# Patient Record
Sex: Male | Born: 1985 | Race: White | Hispanic: No | Marital: Married | Smoking: Current every day smoker
Health system: Southern US, Community
[De-identification: ages and names within clinical notes are randomized; demographics above are authoritative.]

---
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
## 2005-01-14 ENCOUNTER — Emergency Department (HOSPITAL_COMMUNITY): Admission: EM | Admit: 2005-01-14 | Discharge: 2005-01-14 | Payer: Self-pay | Admitting: Emergency Medicine

## 2005-11-07 ENCOUNTER — Emergency Department (HOSPITAL_COMMUNITY): Admission: EM | Admit: 2005-11-07 | Discharge: 2005-11-07 | Payer: Self-pay | Admitting: Emergency Medicine

## 2005-11-14 ENCOUNTER — Emergency Department (HOSPITAL_COMMUNITY): Admission: EM | Admit: 2005-11-14 | Discharge: 2005-11-14 | Payer: Self-pay | Admitting: Emergency Medicine

## 2012-01-08 ENCOUNTER — Emergency Department (HOSPITAL_BASED_OUTPATIENT_CLINIC_OR_DEPARTMENT_OTHER)
Admission: EM | Admit: 2012-01-08 | Discharge: 2012-01-09 | Disposition: A | Discharge status: Home / Self Care | Payer: Self-pay | Attending: Emergency Medicine | Admitting: Emergency Medicine

## 2012-01-08 ENCOUNTER — Encounter (HOSPITAL_BASED_OUTPATIENT_CLINIC_OR_DEPARTMENT_OTHER): Payer: Self-pay | Admitting: *Deleted

## 2012-01-08 ENCOUNTER — Emergency Department (HOSPITAL_BASED_OUTPATIENT_CLINIC_OR_DEPARTMENT_OTHER): Payer: Self-pay

## 2012-01-08 DIAGNOSIS — X58XXXA Exposure to other specified factors, initial encounter: Secondary | ICD-10-CM | POA: Insufficient documentation

## 2012-01-08 DIAGNOSIS — Y9364 Activity, baseball: Secondary | ICD-10-CM | POA: Insufficient documentation

## 2012-01-08 DIAGNOSIS — S6990XA Unspecified injury of unspecified wrist, hand and finger(s), initial encounter: Secondary | ICD-10-CM | POA: Insufficient documentation

## 2012-01-08 DIAGNOSIS — S60221A Contusion of right hand, initial encounter: Secondary | ICD-10-CM

## 2012-01-08 DIAGNOSIS — S60511A Abrasion of right hand, initial encounter: Secondary | ICD-10-CM

## 2012-01-08 DIAGNOSIS — Y998 Other external cause status: Secondary | ICD-10-CM | POA: Insufficient documentation

## 2012-01-08 MED ORDER — NAPROXEN 250 MG PO TABS
500.0000 mg | ORAL_TABLET | Freq: Once | ORAL | Status: AC
  Administered 2012-01-08: 500 mg via ORAL
  Filled 2012-01-08: qty 2

## 2012-01-08 MED ORDER — HYDROCODONE-ACETAMINOPHEN 5-325 MG PO TABS: 1.0000 | ORAL_TABLET | Freq: Four times a day (QID) | ORAL | Status: AC | PRN

## 2012-01-08 NOTE — ED Provider Notes (Signed)
History     CSN: [id number]  Arrival date & time 01-20-2012  2250   First MD Initiated Contact with Patient January 20, 2012 2344      Chief Complaint  Patient presents with  . Hand Injury    (Consider location/radiation/quality/duration/timing/severity/associated sxs/prior treatment) HPI Is a 26 year old white male who was playing softball this evening is that [place] the [place]. In the process he injured his right hand. Specifically he is having pain and swelling of the right hyperthenar eminence. There is no gross deformity and no motor or sensory deficit. There is a superficial abrasion present. The pain is moderate and worse with extension at the right wrist. It is also exacerbated with palpation. He denies other injury. He states his tetanus status is up-to-date.  History reviewed. No pertinent past medical history.  History reviewed. No pertinent past surgical history.  No family history on file.  History  Substance Use Topics  . Smoking status: Not on file  . Smokeless tobacco: Not on file  . Alcohol Use: Not on file      Review of Systems  All other systems reviewed and are negative.    Allergies  Review of patient's allergies indicates no known allergies.  Home Medications  No current outpatient prescriptions on file.  BP 130/67  Pulse 72  Temp 98.1 F (36.7 C) (Oral)  Resp 16  Ht 6' (1.829 m)  Wt 140 lb (63.504 kg)  BMI 18.99 kg/m2  SpO2 98%  Physical Exam General: Well-developed, well-nourished male in no acute distress; appearance consistent with age of record HENT: normocephalic, atraumatic Eyes: Normal appearance Neck: supple Heart: regular rate and rhythm Lungs: clear to auscultation bilaterally Abdomen: soft; nondistended Extremities: No deformity; full range of motion; swelling and tenderness of right hyperthenar eminence with small superficial abrasion; no right snuff box tenderness Neurologic: Awake, alert and oriented; motor function intact in  all extremities and symmetric; no facial droop Skin: Warm and dry Psychiatric: Normal mood and affect    ED Course  Procedures (including critical care time)     MDM  Nursing notes and vitals signs, including pulse oximetry, reviewed.  Summary of this visit's results, reviewed by myself:  Imaging Studies: Dg Hand Complete Right  01/20/12  *RADIOLOGY REPORT*  Clinical Data: 26 year old male blunt trauma, pain.  RIGHT HAND - COMPLETE 3+ VIEW  Comparison: None.  Findings: Distal radius and ulna appear intact.  Carpal bone alignment within normal limits.  Joint spaces preserved. Metacarpals intact.  No acute fracture identified in the right hand.  IMPRESSION: No acute fracture or dislocation identified about the right hand.   Original Report Authenticated By: Harley Hallmark, M.D.             Hanley Seamen, MD Jan 20, 2012 2352

## 2012-01-08 NOTE — ED Notes (Signed)
Pt. Playing soft ball this night and slid into home base per Pt. Jamming the R hand into the ground.  NOted edema on the palm side of hand with pain past the r wrist.

## 2013-05-08 IMAGING — CR DG HAND COMPLETE 3+V*R*
3 series · 3 of 3 positions shown · non-contrast
Comparison: None.

CLINICAL DATA: 25-year-old male blunt trauma, pain.

RIGHT HAND - COMPLETE 3+ VIEW

[x hand pa right]
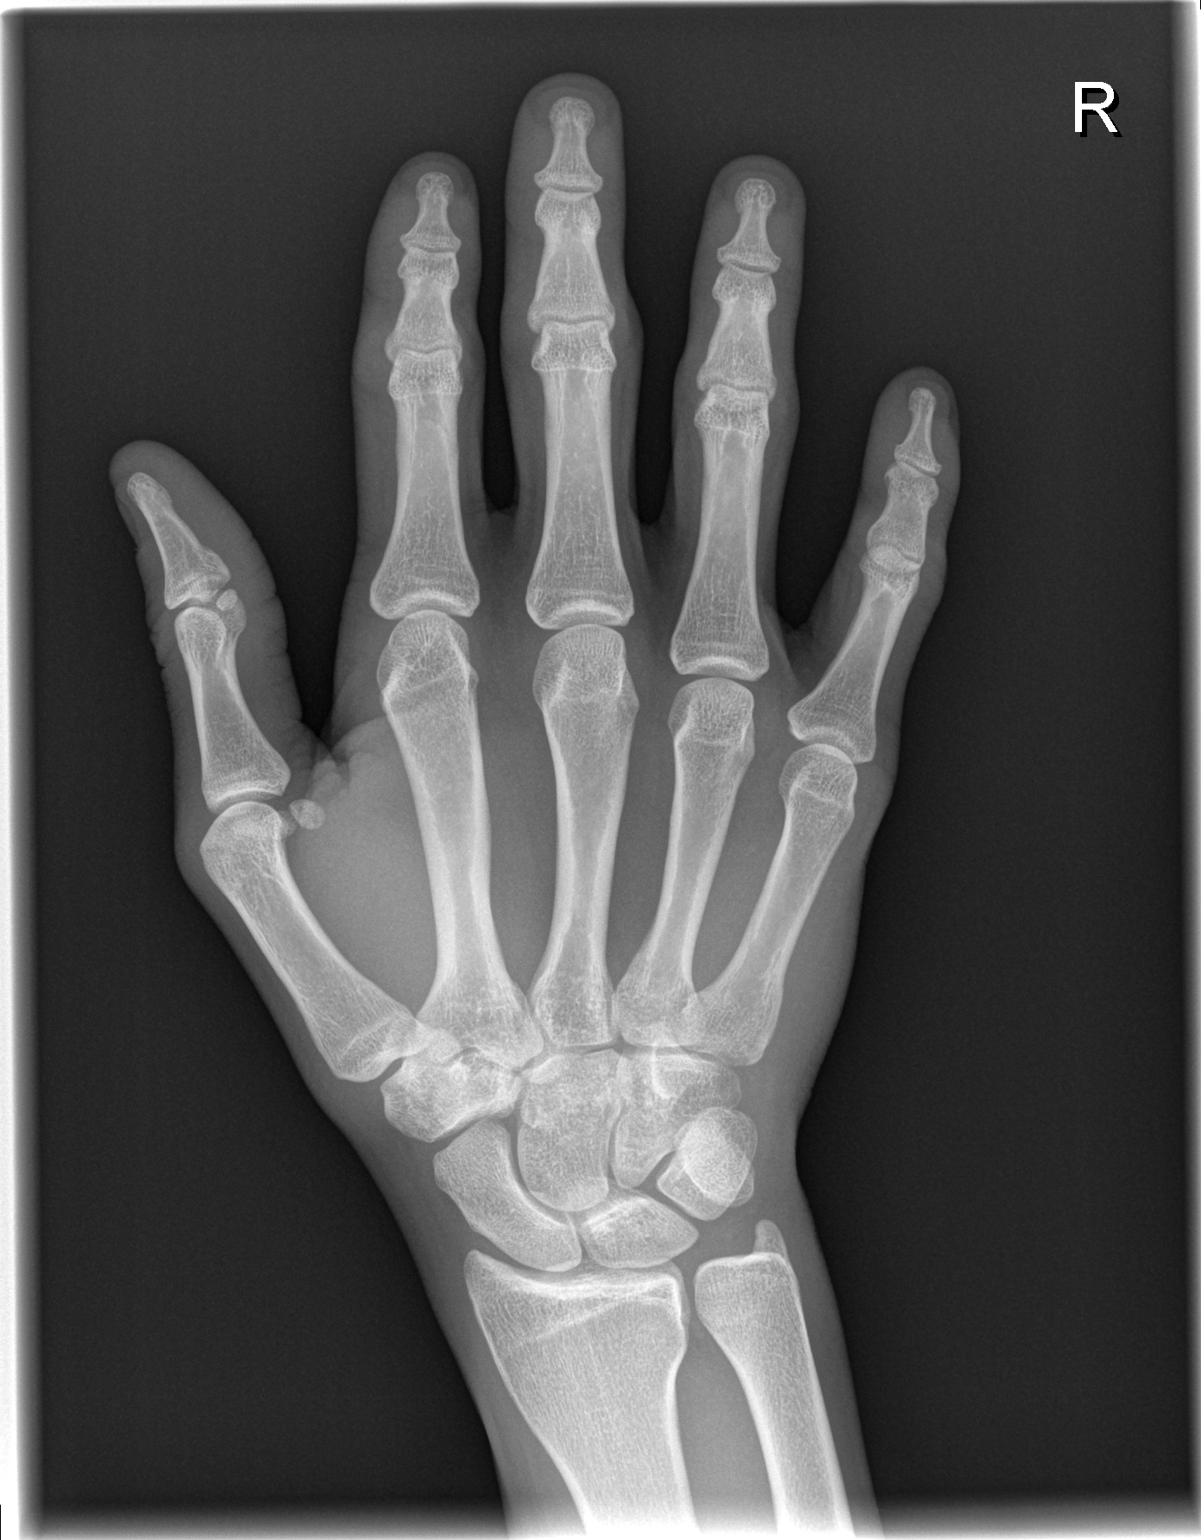

[x hand oblique right]
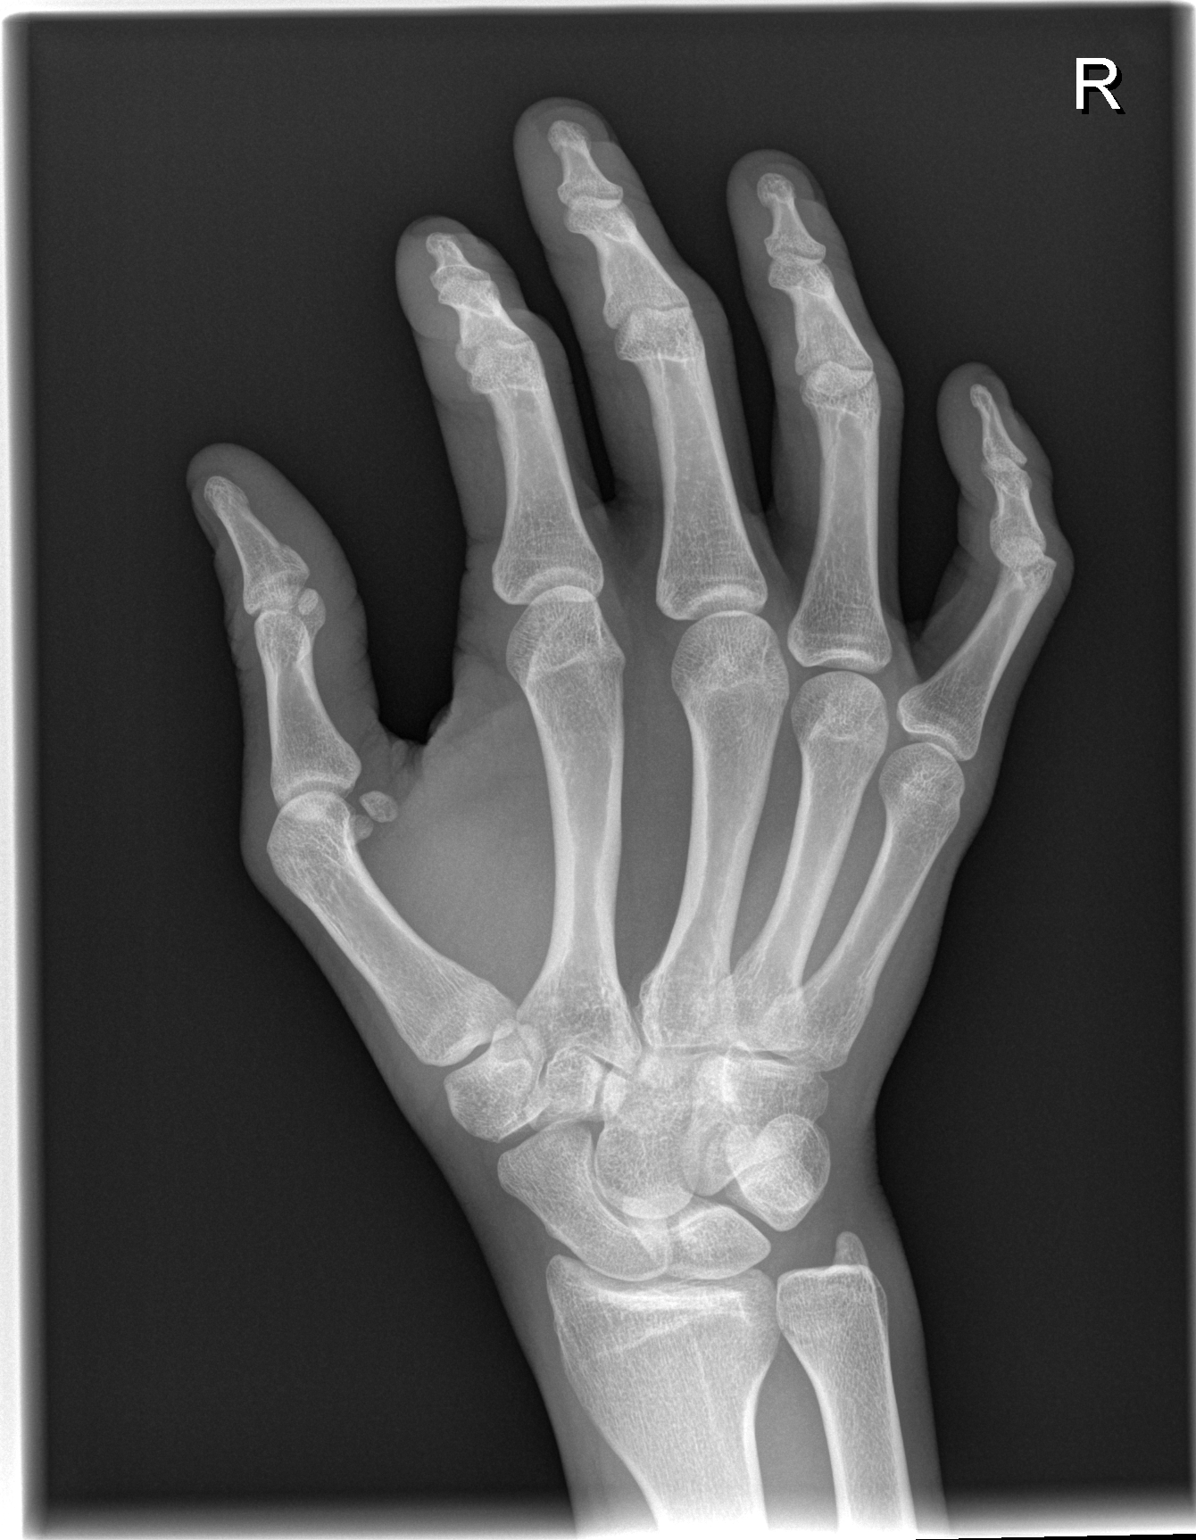

[x hand lat right]
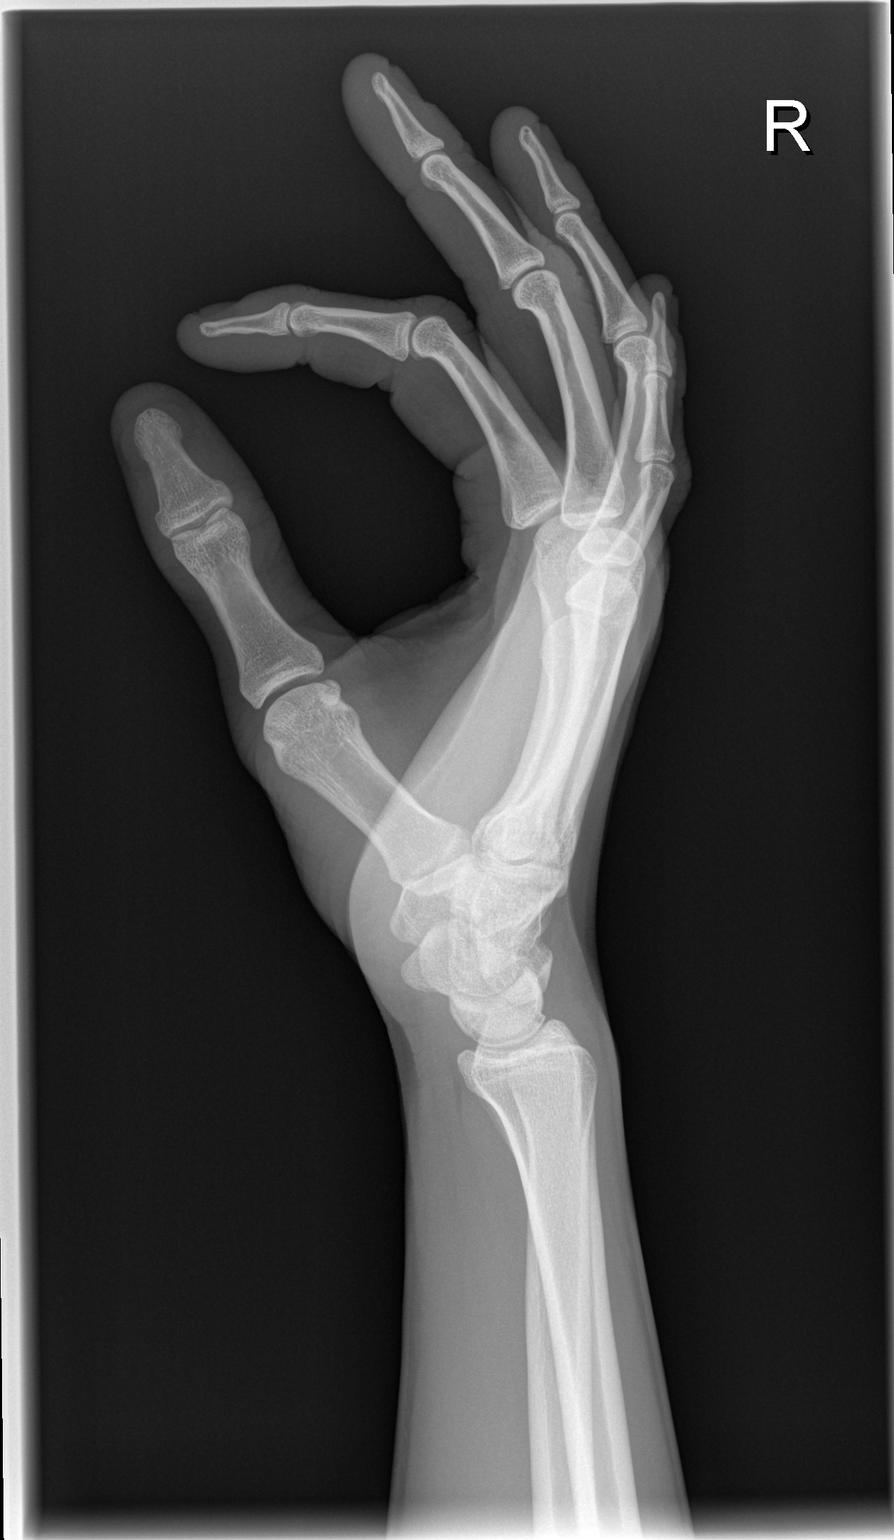

[3 of 3 positions shown; findings below may reference images not displayed]

FINDINGS: Distal radius and ulna appear intact.  Carpal bone
alignment within normal limits.  Joint spaces preserved.
Metacarpals intact.  No acute fracture identified in the right
hand.
IMPRESSION: No acute fracture or dislocation identified about the right hand.

## 2015-08-25 ENCOUNTER — Encounter (HOSPITAL_BASED_OUTPATIENT_CLINIC_OR_DEPARTMENT_OTHER): Payer: Self-pay | Admitting: *Deleted

## 2015-08-25 ENCOUNTER — Emergency Department (HOSPITAL_BASED_OUTPATIENT_CLINIC_OR_DEPARTMENT_OTHER)
Admission: EM | Admit: 2015-08-25 | Discharge: 2015-08-25 | Disposition: A | Discharge status: Home / Self Care | Payer: BLUE CROSS/BLUE SHIELD | Attending: Emergency Medicine | Admitting: Emergency Medicine

## 2015-08-25 DIAGNOSIS — L03116 Cellulitis of left lower limb: Secondary | ICD-10-CM | POA: Insufficient documentation

## 2015-08-25 DIAGNOSIS — F17200 Nicotine dependence, unspecified, uncomplicated: Secondary | ICD-10-CM | POA: Diagnosis not present

## 2015-08-25 DIAGNOSIS — W57XXXA Bitten or stung by nonvenomous insect and other nonvenomous arthropods, initial encounter: Secondary | ICD-10-CM | POA: Insufficient documentation

## 2015-08-25 DIAGNOSIS — S70362A Insect bite (nonvenomous), left thigh, initial encounter: Secondary | ICD-10-CM | POA: Diagnosis present

## 2015-08-25 DIAGNOSIS — Y939 Activity, unspecified: Secondary | ICD-10-CM | POA: Insufficient documentation

## 2015-08-25 DIAGNOSIS — Y929 Unspecified place or not applicable: Secondary | ICD-10-CM | POA: Diagnosis not present

## 2015-08-25 DIAGNOSIS — R51 Headache: Secondary | ICD-10-CM | POA: Insufficient documentation

## 2015-08-25 DIAGNOSIS — Y999 Unspecified external cause status: Secondary | ICD-10-CM | POA: Diagnosis not present

## 2015-08-25 MED ORDER — DOXYCYCLINE HYCLATE 100 MG PO CAPS: 100.0000 mg | ORAL_CAPSULE | Freq: Two times a day (BID) | ORAL | Status: DC

## 2015-08-25 NOTE — ED Notes (Signed)
Pt pulled tick off left leg yesterday, now reports area of erythema

## 2015-08-25 NOTE — ED Notes (Signed)
Pt states he pulled a tick off of his left thigh on Friday. Now presents with a reddened circular area 11 cm x 12 cm in diameter. +itching. Also has two smaller areas to the lateral aspect of same. Areas marked and dated.

## 2015-08-25 NOTE — ED Provider Notes (Signed)
CSN: [id number]     Arrival date & time 08/25/15  1841 History  By signing my name below, I, Phillis Haggis, attest that this documentation has been prepared under the direction and in the presence of Arby Barrette, MD. Electronically Signed: Phillis Haggis, ED Scribe. 08/25/2015. 7:25 PM.   Chief Complaint  Patient presents with  . Insect Bite   The history is provided by the patient. No language interpreter was used.  HPI Comments: Robert Sheppard is a 30 y.o. male who presents to the Emergency Department complaining of a tick bite onset one day ago. Pt states that he pulled a tick off of his left thigh yesterday morning. He states that he has been experiencing a gradually spreading area of redness around the site. He states that he has pulled ticks off of his skin before, but has never had this type of reaction. He reports associated headache. He has not taken anything for his symptoms. He denies fever, chills, cough, nausea, or vomiting. He denies allergies to medications. Pt does not have a PCP.   No past medical history on file. History reviewed. No pertinent past surgical history. No family history on file. Social History  Substance Use Topics  . Smoking status: Current Every Day Smoker  . Smokeless tobacco: Current User    Types: Snuff  . Alcohol Use: Yes     Comment: rare    Review of Systems  Constitutional: Negative for fever and chills.  Respiratory: Negative for cough.   Gastrointestinal: Negative for nausea and vomiting.  Skin: Positive for rash.  Neurological: Positive for headaches.   Allergies  Review of patient's allergies indicates no known allergies.  Home Medications   Prior to Admission medications   Medication Sig Start Date End Date Taking? Authorizing Provider  doxycycline (VIBRAMYCIN) 100 MG capsule Take 1 capsule (100 mg total) by mouth 2 (two) times daily. One po bid x 7 days 08/25/15   Arby Barrette, MD   BP 112/77 mmHg  Pulse 85  Temp(Src) 98.5 F  (36.9 C) (Oral)  Ht 6' (1.829 m)  Wt 145 lb (65.772 kg)  BMI 19.66 kg/m2  SpO2 99% Physical Exam  Constitutional: He is oriented to person, place, and time. He appears well-developed and well-nourished.  Non-toxic appearance. No distress.  HENT:  Head: Normocephalic and atraumatic.  Nose: Nose normal.  Mouth/Throat: Oropharynx is clear and moist. No oropharyngeal exudate.  Eyes: Conjunctivae and EOM are normal. Pupils are equal, round, and reactive to light. No scleral icterus.  Neck: Normal range of motion. Neck supple. No thyromegaly present.  Cardiovascular: Normal rate and regular rhythm.   Pulmonary/Chest: Effort normal and breath sounds normal. No respiratory distress.  Abdominal: Soft. He exhibits no mass. There is no tenderness.  Musculoskeletal: Normal range of motion. He exhibits no edema or tenderness.  Left leg: No peripheral edema, no joint effusions  Lymphadenopathy:    He has no cervical adenopathy.  Neurological: He is alert and oriented to person, place, and time. He has normal reflexes. He displays normal reflexes. No cranial nerve deficit. He exhibits normal muscle tone. Coordination normal.  Skin: Skin is warm and dry.  13 x 13 cm erythematous plaque; central erythema with very small eschar 2 mm; well circumcised area of erythema ; 2 small papules left lateral thigh with 1 cm surrounding erythema  Psychiatric: He has a normal mood and affect. His behavior is normal.  Nursing note and vitals reviewed.   ED Course  Procedures (including critical  care time) DIAGNOSTIC STUDIES: Oxygen Saturation is 99% on RA, normal by my interpretation.    COORDINATION OF CARE: 7:22 PM-Discussed treatment plan which includes antibiotics with pt at bedside and pt agreed to plan.    Labs Review Labs Reviewed - No data to display  Imaging Review No results found. I have personally reviewed and evaluated these images and lab results as part of my medical decision-making.   EKG  Interpretation None      MDM   Final diagnoses:  Tick bite  Cellulitis of left lower extremity   Patient has cellulitis associated with a tick bite. He does not have associated constitutional symptoms and is otherwise healthy. He does however have a large plaque of surrounding cellulitis. Patient will be empirically started on doxycycline. Signs and symptoms for return are reviewed.   Arby BarretteMarcy Brealynn Contino, MD 08/31/15 [phone]

## 2015-08-25 NOTE — Discharge Instructions (Signed)
Cellulitis Cellulitis is an infection of the skin and the tissue beneath it. The infected area is usually red and tender. Cellulitis occurs most often in the arms and lower legs.  CAUSES  Cellulitis is caused by bacteria that enter the skin through cracks or cuts in the skin. The most common types of bacteria that cause cellulitis are staphylococci and streptococci. SIGNS AND SYMPTOMS   Redness and warmth.  Swelling.  Tenderness or pain.  Fever. DIAGNOSIS  Your health care provider can usually determine what is wrong based on a physical exam. Blood tests may also be done. TREATMENT  Treatment usually involves taking an antibiotic medicine. HOME CARE INSTRUCTIONS   Take your antibiotic medicine as directed by your health care provider. Finish the antibiotic even if you start to feel better.  Keep the infected arm or leg elevated to reduce swelling.  Apply a warm cloth to the affected area up to 4 times per day to relieve pain.  Take medicines only as directed by your health care provider.  Keep all follow-up visits as directed by your health care provider. SEEK MEDICAL CARE IF:  1. You notice red streaks coming from the infected area. 2. Your red area gets larger or turns dark in color. 3. Your bone or joint underneath the infected area becomes painful after the skin has healed. 4. Your infection returns in the same area or another area. 5. You notice a swollen bump in the infected area. 6. You develop new symptoms. 7. You have a fever. SEEK IMMEDIATE MEDICAL CARE IF:   You feel very sleepy.  You develop vomiting or diarrhea.  You have a general ill feeling (malaise) with muscle aches and pains.   This information is not intended to replace advice given to you by your health care provider. Make sure you discuss any questions you have with your health care provider.   Document Released: 01/08/2005 Document Revised: 12/20/2014 Document Reviewed: 06/16/2011 Elsevier  Interactive Patient Education 2016 Elsevier Inc.  Tick Bite Information Ticks are insects that attach themselves to the skin and draw blood for food. There are various types of ticks. Common types include wood ticks and deer ticks. Most ticks live in shrubs and grassy areas. Ticks can climb onto your body when you make contact with leaves or grass where the tick is waiting. The most common places on the body for ticks to attach themselves are the scalp, neck, armpits, waist, and groin. Most tick bites are harmless, but sometimes ticks carry germs that cause diseases. These germs can be spread to a person during the tick's feeding process. The chance of a disease spreading through a tick bite depends on:   The type of tick.  Time of year.   How long the tick is attached.   Geographic location.  HOW CAN YOU PREVENT TICK BITES? Take these steps to help prevent tick bites when you are outdoors:  Wear protective clothing. Long sleeves and long pants are best.   Wear white clothes so you can see ticks more easily.  Tuck your pant legs into your socks.   If walking on a trail, stay in the middle of the trail to avoid brushing against bushes.  Avoid walking through areas with long grass.  Put insect repellent on all exposed skin and along boot tops, pant legs, and sleeve cuffs.   Check clothing, hair, and skin repeatedly and before going inside.   Brush off any ticks that are not attached.  Take a  shower or bath as soon as possible after being outdoors.  WHAT IS THE PROPER WAY TO REMOVE A TICK? Ticks should be removed as soon as possible to help prevent diseases caused by tick bites. 8. If latex gloves are available, put them on before trying to remove a tick.  9. Using fine-point tweezers, grasp the tick as close to the skin as possible. You may also use curved forceps or a tick removal tool. Grasp the tick as close to its head as possible. Avoid grasping the tick on its  body. 10. Pull gently with steady upward pressure until the tick lets go. Do not twist the tick or jerk it suddenly. This may break off the tick's head or mouth parts. 11. Do not squeeze or crush the tick's body. This could force disease-carrying fluids from the tick into your body.  12. After the tick is removed, wash the bite area and your hands with soap and water or other disinfectant such as alcohol. 13. Apply a small amount of antiseptic cream or ointment to the bite site.  14. Wash and disinfect any instruments that were used.  Do not try to remove a tick by applying a hot match, petroleum jelly, or fingernail polish to the tick. These methods do not work and may increase the chances of disease being spread from the tick bite.  WHEN SHOULD YOU SEEK MEDICAL CARE? Contact your health care provider if you are unable to remove a tick from your skin or if a part of the tick breaks off and is stuck in the skin.  After a tick bite, you need to be aware of signs and symptoms that could be related to diseases spread by ticks. Contact your health care provider if you develop any of the following in the days or weeks after the tick bite:  Unexplained fever.  Rash. A circular rash that appears days or weeks after the tick bite may indicate the possibility of Lyme disease. The rash may resemble a target with a bull's-eye and may occur at a different part of your body than the tick bite.  Redness and swelling in the area of the tick bite.   Tender, swollen lymph glands.   Diarrhea.   Weight loss.   Cough.   Fatigue.   Muscle, joint, or bone pain.   Abdominal pain.   Headache.   Lethargy or a change in your level of consciousness.  Difficulty walking or moving your legs.   Numbness in the legs.   Paralysis.  Shortness of breath.   Confusion.   Repeated vomiting.    This information is not intended to replace advice given to you by your health care provider.  Make sure you discuss any questions you have with your health care provider.   Document Released: 03/28/2000 Document Revised: 04/21/2014 Document Reviewed: 09/08/2012 Elsevier Interactive Patient Education [employer]

## 2015-12-13 DIAGNOSIS — Z024 Encounter for examination for driving license: Secondary | ICD-10-CM | POA: Insufficient documentation

## 2019-04-28 ENCOUNTER — Ambulatory Visit: Payer: PRIVATE HEALTH INSURANCE | Attending: Internal Medicine

## 2019-04-28 DIAGNOSIS — Z20822 Contact with and (suspected) exposure to covid-19: Secondary | ICD-10-CM

## 2019-04-29 LAB — NOVEL CORONAVIRUS, NAA: SARS-CoV-2, NAA: NOT DETECTED

## 2021-03-19 ENCOUNTER — Other Ambulatory Visit: Payer: Self-pay

## 2021-03-19 ENCOUNTER — Ambulatory Visit
Admission: EM | Admit: 2021-03-19 | Discharge: 2021-03-19 | Disposition: A | Discharge status: Home / Self Care | Payer: BC Managed Care – PPO | Attending: Physician Assistant | Admitting: Physician Assistant

## 2021-03-19 DIAGNOSIS — R6889 Other general symptoms and signs: Secondary | ICD-10-CM

## 2021-03-19 DIAGNOSIS — J069 Acute upper respiratory infection, unspecified: Secondary | ICD-10-CM | POA: Diagnosis not present

## 2021-03-19 MED ORDER — OSELTAMIVIR PHOSPHATE 75 MG PO CAPS: 75.0000 mg | ORAL_CAPSULE | Freq: Two times a day (BID) | ORAL | 0 refills | Status: AC

## 2021-03-19 NOTE — ED Provider Notes (Signed)
[facility]    CSN: [id number] Arrival date & time: 03/19/21  0957      History   Chief Complaint Chief Complaint  Patient presents with   Fever   Generalized Body Aches    HPI Robert Sheppard is a 35 y.o. male.   Patient here today for evaluation of fever, body aches, congestion and headache that started yesterday.  He reports that he has had a T-max of 102.1.  He has no known flu exposure.  He has not had any vomiting or diarrhea.  He has tried over-the-counter medication without significant relief.  The history is provided by the patient.  Fever Associated symptoms: chills, congestion, cough, headaches, myalgias and sore throat   Associated symptoms: no diarrhea, no ear pain, no nausea and no vomiting    History reviewed. No pertinent past medical history.  There are no problems to display for this patient.   History reviewed. No pertinent surgical history.     Home Medications    Prior to Admission medications   Medication Sig Start Date End Date Taking? Authorizing Provider  oseltamivir (TAMIFLU) 75 MG capsule Take 1 capsule (75 mg total) by mouth every 12 (twelve) hours. 03/19/21  Yes Tomi Bamberger, PA-C  doxycycline (VIBRAMYCIN) 100 MG capsule Take 1 capsule (100 mg total) by mouth 2 (two) times daily. One po bid x 7 days 08/25/15   Arby Barrette, MD    Family History History reviewed. No pertinent family history.  Social History Social History   Tobacco Use   Smoking status: Every Day    Types: Cigarettes   Smokeless tobacco: Former    Types: Snuff  Substance Use Topics   Alcohol use: Yes    Comment: rare   Drug use: No     Allergies   Patient has no known allergies.   Review of Systems Review of Systems  Constitutional:  Positive for chills and fever.  HENT:  Positive for congestion and sore throat. Negative for ear pain.   Eyes:  Negative for discharge and redness.  Respiratory:  Positive for cough. Negative for shortness  of breath.   Gastrointestinal:  Negative for abdominal pain, diarrhea, nausea and vomiting.  Musculoskeletal:  Positive for myalgias.  Neurological:  Positive for headaches.    Physical Exam Triage Vital Signs ED Triage Vitals  Enc Vitals Group     BP 03/19/21 1203 118/75     Pulse Rate 03/19/21 1203 75     Resp 03/19/21 1203 18     Temp 03/19/21 1203 99.3 F (37.4 C)     Temp Source 03/19/21 1203 Oral     SpO2 03/19/21 1203 97 %     Weight --      Height --      Head Circumference --      Peak Flow --      Pain Score 03/19/21 1206 5     Pain Loc --      Pain Edu? --      Excl. in GC? --    No data found.  Updated Vital Signs BP 118/75 (BP Location: Left Arm)   Pulse 75   Temp 99.3 F (37.4 C) (Oral)   Resp 18   SpO2 97%   Physical Exam Vitals and nursing note reviewed.  Constitutional:      General: He is not in acute distress.    Appearance: Normal appearance. He is not ill-appearing.  HENT:     Head: Normocephalic and  atraumatic.     Nose: Congestion present.     Mouth/Throat:     Mouth: Mucous membranes are moist.     Pharynx: Oropharynx is clear. No oropharyngeal exudate or posterior oropharyngeal erythema.  Eyes:     Conjunctiva/sclera: Conjunctivae normal.  Cardiovascular:     Rate and Rhythm: Normal rate and regular rhythm.     Heart sounds: Normal heart sounds. No murmur heard. Pulmonary:     Effort: Pulmonary effort is normal. No respiratory distress.     Breath sounds: Normal breath sounds. No wheezing, rhonchi or rales.  Skin:    General: Skin is warm and dry.  Neurological:     Mental Status: He is alert.  Psychiatric:        Mood and Affect: Mood normal.        Thought Content: Thought content normal.     UC Treatments / Results  Labs (all labs ordered are listed, but only abnormal results are displayed) Labs Reviewed  COVID-19, FLU A+B NAA    EKG   Radiology No results found.  Procedures Procedures (including critical care  time)  Medications Ordered in UC Medications - No data to display  Initial Impression / Assessment and Plan / UC Course  I have reviewed the triage vital signs and the nursing notes.  Pertinent labs & imaging results that were available during my care of the patient were reviewed by me and considered in my medical decision making (see chart for details).    Suspect influenza given symptoms we will treat with Tamiflu.  COVID and flu screening ordered.  Encouraged symptomatic treatment otherwise, increase fluids and rest.  Recommend follow-up with any further concerns.  Final Clinical Impressions(s) / UC Diagnoses   Final diagnoses:  Acute upper respiratory infection  Flu-like symptoms   Discharge Instructions   None    ED Prescriptions     Medication Sig Dispense Auth. Provider   oseltamivir (TAMIFLU) 75 MG capsule Take 1 capsule (75 mg total) by mouth every 12 (twelve) hours. 10 capsule Tomi Bamberger, PA-C      PDMP not reviewed this encounter.   Tomi Bamberger, PA-C 03/19/21 1242

## 2021-03-19 NOTE — ED Triage Notes (Signed)
Onset yesterday of fever, body aches, cough and onset today of HA. Tmax 102.1. Has been taking ibuprofen w/o relief. No v/d.

## 2021-03-20 LAB — COVID-19, FLU A+B NAA
Influenza A, NAA: DETECTED — AB
Influenza B, NAA: NOT DETECTED
SARS-CoV-2, NAA: NOT DETECTED

## 2023-03-07 ENCOUNTER — Ambulatory Visit
Admission: EM | Admit: 2023-03-07 | Discharge: 2023-03-07 | Disposition: A | Discharge status: Home / Self Care | Payer: BC Managed Care – PPO | Attending: Physician Assistant | Admitting: Physician Assistant

## 2023-03-07 DIAGNOSIS — J329 Chronic sinusitis, unspecified: Secondary | ICD-10-CM

## 2023-03-07 DIAGNOSIS — J40 Bronchitis, not specified as acute or chronic: Secondary | ICD-10-CM | POA: Diagnosis not present

## 2023-03-07 MED ORDER — AMOXICILLIN-POT CLAVULANATE 875-125 MG PO TABS: 1.0000 | ORAL_TABLET | Freq: Two times a day (BID) | ORAL | 0 refills | Status: AC

## 2023-03-07 NOTE — ED Provider Notes (Signed)
[facility]    CSN: [id number] Arrival date & time: 03/07/23  1018      History   Chief Complaint No chief complaint on file.   HPI Robert Sheppard is a 37 y.o. male.   Patient here today for evaluation of cough, body aches, congestion, sore throat and ear pain that started about 5 to 6 days ago.  He reports he has tried taking ibuprofen and Tylenol cold and flu without resolution.  He states symptoms seem to be worsening and improving.  He denies any vomiting or diarrhea.  The history is provided by the patient.    History reviewed. No pertinent past medical history.  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 No pertinent surgical history.     Home Medications    Prior to Admission medications   Medication Sig Start Date End Date Taking? Authorizing Provider  amoxicillin-clavulanate (AUGMENTIN) 875-125 MG tablet Take 1 tablet by mouth every 12 (twelve) hours. 03/07/23  Yes Tomi Bamberger, PA-C  oseltamivir (TAMIFLU) 75 MG capsule Take 1 capsule (75 mg total) by mouth every 12 (twelve) hours. 03/19/21   Tomi Bamberger, PA-C    Family History History reviewed. No pertinent family history.  Social History Social History   Tobacco Use   Smoking status: Every Day    Types: Cigarettes   Smokeless tobacco: Former    Types: Snuff  Substance Use Topics   Alcohol use: Yes    Comment: rare   Drug use: No     Allergies   Patient has no known allergies.   Review of Systems Review of Systems  Constitutional:  Negative for chills and fever.  HENT:  Positive for congestion, sinus pressure and sore throat. Negative for ear pain.   Eyes:  Negative for discharge and redness.  Respiratory:  Positive for cough. Negative for shortness of breath.   Gastrointestinal:  Negative for abdominal pain, nausea and vomiting.   Musculoskeletal:  Positive for myalgias.     Physical Exam Triage Vital Signs ED Triage Vitals  Encounter Vitals Group     BP 03/07/23 1113 131/81     Systolic BP Percentile --      Diastolic BP Percentile --      Pulse Rate 03/07/23 1113 (!) 103     Resp 03/07/23 1113 20     Temp 03/07/23 1113 98.7 F (37.1 C)     Temp Source 03/07/23 1113 Oral     SpO2 03/07/23 1113 96 %     Weight --      Height --      Head Circumference --      Peak Flow --      Pain Score 03/07/23 1115 7     Pain Loc --      Pain Education --      Exclude from Growth Chart --    No data found.  Updated Vital Signs BP 131/81 (BP Location: Left Arm)   Pulse (!) 103   Temp 98.7 F (37.1 C) (Oral)   Resp 20   SpO2 96%    Physical Exam Vitals and nursing note reviewed.  Constitutional:      General: He is not in acute distress.    Appearance: Normal appearance. He is not ill-appearing.  HENT:     Head: Normocephalic and atraumatic.     Nose: Congestion  present.     Mouth/Throat:     Mouth: Mucous membranes are moist.     Pharynx: Oropharynx is clear. No oropharyngeal exudate or posterior oropharyngeal erythema.  Eyes:     Conjunctiva/sclera: Conjunctivae normal.  Cardiovascular:     Rate and Rhythm: Normal rate and regular rhythm.     Heart sounds: Normal heart sounds. No murmur heard. Pulmonary:     Effort: Pulmonary effort is normal. No respiratory distress.     Breath sounds: Normal breath sounds. No wheezing, rhonchi or rales.  Skin:    General: Skin is warm and dry.  Neurological:     Mental Status: He is alert.  Psychiatric:        Mood and Affect: Mood normal.        Thought Content: Thought content normal.      UC Treatments / Results  Labs (all labs ordered are listed, but only abnormal results are displayed) Labs Reviewed - No data to display  EKG   Radiology No results found.  Procedures Procedures (including critical care time)  Medications Ordered in  UC Medications - No data to display  Initial Impression / Assessment and Plan / UC Course  I have reviewed the triage vital signs and the nursing notes.  Pertinent labs & imaging results that were available during my care of the patient were reviewed by me and considered in my medical decision making (see chart for details).    Given duration of symptoms we will treat to cover sinobronchitis with Augmentin.  Encouraged follow-up if no gradual improvement with any further concerns.  Final Clinical Impressions(s) / UC Diagnoses   Final diagnoses:  Sinobronchitis   Discharge Instructions   None    ED Prescriptions     Medication Sig Dispense Auth. Provider   amoxicillin-clavulanate (AUGMENTIN) 875-125 MG tablet Take 1 tablet by mouth every 12 (twelve) hours. 14 tablet Tomi Bamberger, PA-C      PDMP not reviewed this encounter.   Tomi Bamberger, PA-C 03/07/23 1450

## 2023-03-07 NOTE — ED Triage Notes (Signed)
Cough, body aches, nasal congestion, sore throat, ear pain since Tuesday. Took ibuprofen and Tylenol cold and flu today.

## 2023-07-20 ENCOUNTER — Other Ambulatory Visit (INDEPENDENT_AMBULATORY_CARE_PROVIDER_SITE_OTHER): Payer: Self-pay

## 2023-07-20 ENCOUNTER — Ambulatory Visit: Admitting: Orthopedic Surgery

## 2023-07-20 VITALS — BP 137/85 | HR 67 | Ht 73.0 in | Wt 178.8 lb

## 2023-07-20 DIAGNOSIS — M542 Cervicalgia: Secondary | ICD-10-CM | POA: Diagnosis not present

## 2023-07-20 MED ORDER — METHYLPREDNISOLONE 4 MG PO TBPK: ORAL_TABLET | ORAL | 0 refills | Status: AC

## 2023-07-20 MED ORDER — PREGABALIN 75 MG PO CAPS: 75.0000 mg | ORAL_CAPSULE | Freq: Two times a day (BID) | ORAL | 1 refills | Status: AC

## 2023-07-20 NOTE — Progress Notes (Signed)
 Orthopedic Spine Surgery Office Note  Assessment: Patient is a 38 y.o. male with neck pain that radiates into the right lateral arm, right radial forearm, into the radial 2 digits -suspect radiculopathy   Plan: -Explained that initially conservative treatment is tried as a significant number of patients may experience relief with these treatment modalities. Discussed that the conservative treatments include:  -activity modification  -physical therapy  -over the counter pain medications  -medrol dosepak  -lumbar steroid injections -Patient has tried Tylenol, ibuprofen -Recommended Medrol Dosepak which was prescribed him today.  Prescribed Lyrica for additional pain relief -If patient is not doing any better at our next visit, we will order an MRI of the cervical spine to evaluate for radiculopathy -Would need to be nicotine free prior to any elective spine surgery -Patient should return to office in 6 weeks, x-rays at next visit: None   Patient expressed understanding of the plan and all questions were answered to the patient's satisfaction.   ___________________________________________________________________________   History:  Patient is a 38 y.o. male who presents today for cervical spine.  Patient has had about 2 weeks of neck pain that radiates into his right upper extremity.  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  He feels the pain starting in his neck and going into the posterior scapular region.  And then goes into the lateral aspect of the arm, and into the ulnar forearm.  It mostly goes into the radial 2 digits but sometimes will feel it in all the fingers.  He does not have any pain radiating into the left upper extremity.  He describes the pain as intense.  He notes that with activity and at rest.  He has had trouble sleeping at night as a result of the pain.   Weakness: Denies Symptoms of imbalance:  Denies Clumsiness with hands: Denies Paresthesias and numbness: Denies Bowel or bladder incontinence: Denies Saddle anesthesia: Denies  Treatments tried: Tylenol, ibuprofen  Review of systems: Denies fevers and chills, night sweats, unexplained weight loss, history of cancer.  Has had pain that wakes him at night  Past medical history: None  Allergies: NKDA  Past surgical history:  None  Social history: Reports use of nicotine product (smoking, vaping, patches, smokeless) Alcohol use: Yes, approximately 20 drinks per week Denies recreational drug use   Physical Exam:  BMI of 23.6  General: no acute distress, appears stated age Neurologic: alert, answering questions appropriately, following commands Respiratory: unlabored breathing on room air, symmetric chest rise Psychiatric: appropriate affect, normal cadence to speech  Physical Exam:  General: no acute distress, appears stated age Neurologic: alert, answering questions appropriately, following commands Respiratory: unlabored breathing on room air, symmetric chest rise Psychiatric: appropriate affect, normal cadence to speech   MSK (spine):  -Strength exam      Left  Right Grip strength                5/5  5/5 Interosseus   5/5   5/5 Wrist extension  5/5  5/5 Wrist flexion   5/5  5/5 Elbow flexion   5/5  5/5 Deltoid    5/5  5/5  EHL    5/5  5/5 TA    5/5  5/5 GSC    5/5  5/5 Knee extension  5/5  5/5 Hip flexion   5/5  5/5  -Sensory exam    Sensation intact to  light touch in L3-S1 nerve distributions of bilateral lower extremities  Sensation intact to light touch in C5-T1 nerve distributions of bilateral upper extremities  -Brachioradialis DTR: 2/4 on the left, 2/4 on the right -Biceps DTR: 2/4 on the left, 2/4 on the right -Achilles DTR: 2/4 on the left, 2/4 on the right -Patellar tendon DTR: 2/4 on the left, 2/4 on the right  -Spurling: Negative bilaterally -Hoffman sign: Negative  bilaterally -Clonus: No beats bilaterally -Interosseous wasting: None seen -Grip and release test: Negative -Romberg: Negative -Gait: Normal  Left shoulder exam: No pain through range of motion Right shoulder exam: No pain through range of motion  Tinel's at wrist: Negative bilaterally Phalen's at wrist: Negative bilaterally Durkan's: Negative bilaterally  Tinel's at elbow: Negative bilaterally  Imaging: XRs of the cervical spine from 07/20/2023 were independently reviewed and interpreted, showing no significant degenerative changes.  No evidence of instability on flexion/extension views.  No fracture or dislocation seen.   Patient name: Robert Sheppard Patient MRN: [id number] Date of visit: 07/20/23

## 2023-08-31 ENCOUNTER — Ambulatory Visit: Admitting: Orthopedic Surgery

## 2023-08-31 DIAGNOSIS — M5412 Radiculopathy, cervical region: Secondary | ICD-10-CM

## 2023-08-31 NOTE — Progress Notes (Signed)
 Orthopedic Spine Surgery Office Note   Assessment: Patient is a 38 y.o. male with neck pain that radiates into the right lateral arm, right radial forearm, into the radial 2 digits. Has improved significantly with conservative treatments.      Plan: -Patient has tried Tylenol , ibuprofen, medrol  dosepak, lyrica  -Since he is doing much better, did not recommend any further intervention at this time -I expect that his symptoms will continue to improve -If he is still having symptoms at our next visit will get an MRI. Holding off for now given the significant improvement -Would need to be nicotine free prior to any elective spine surgery -Patient should return to office in 6 weeks, x-rays at next visit: none     Patient expressed understanding of the plan and all questions were answered to the patient's satisfaction.    ___________________________________________________________________________     History:   Patient is a 38 y.o. male who presents today for follow up on his cervical spine.  Patient has noted significant improvement since he was last seen in the office.  He says that he is about 80% better.  He said the Medrol  Dosepak was very helpful.  He did not tolerate the Lyrica .  He noticed dizziness with that.  He said the majority of his pain has resolved but he does still have some numbness in the same distribution as he had the pain.  He feels it in the lateral aspect of his arm and into the radial forearm and radial 2 digits.  He has not developed any new symptoms since he was last seen in the office.   Treatments tried: Tylenol , ibuprofen, medrol  dosepak, lyrica     Physical Exam:   General: no acute distress, appears stated age Neurologic: alert, answering questions appropriately, following commands Respiratory: unlabored breathing on room air, symmetric chest rise Psychiatric: appropriate affect, normal cadence to speech     MSK (spine):   -Strength exam                                                    Left                  Right Grip strength                5/5                  5/5 Interosseus                  5/5                  5/5 Wrist extension            5/5                  5/5 Wrist flexion                 5/5                  5/5 Elbow flexion                5/5                  5/5 Deltoid  5/5                  5/5   -Sensory exam                          Sensation intact to light touch in C5-T1 nerve distributions of bilateral upper extremities   -Brachioradialis DTR: 2/4 on the left, 2/4 on the right -Biceps DTR: 2/4 on the left, 2/4 on the right   -Spurling: Negative bilaterally   Imaging: XRs of the cervical spine from 07/20/2023 were previously independently reviewed and interpreted, showing no significant degenerative changes.  No evidence of instability on flexion/extension views.  No fracture or dislocation seen.     Patient name: Robert Sheppard Patient MRN: [id number] Date of visit: 08/31/23

## 2023-10-12 ENCOUNTER — Ambulatory Visit: Admitting: Orthopedic Surgery

## 2024-02-15 ENCOUNTER — Encounter: Payer: Self-pay | Admitting: Radiology
